# Patient Record
Sex: Male | Born: 1960 | Race: Black or African American | Marital: Married
Health system: Southern US, Community
[De-identification: ages and names within clinical notes are randomized; demographics above are authoritative.]

---

## 2016-09-05 ENCOUNTER — Other Ambulatory Visit: Payer: Self-pay | Admitting: Neurology

## 2016-09-05 DIAGNOSIS — R471 Dysarthria and anarthria: Secondary | ICD-10-CM

## 2016-09-15 ENCOUNTER — Ambulatory Visit
Admission: RE | Admit: 2016-09-15 | Discharge: 2016-09-15 | Disposition: A | Discharge status: Home / Self Care | Payer: BLUE CROSS/BLUE SHIELD | Source: Ambulatory Visit | Attending: Neurology | Admitting: Neurology

## 2016-09-15 DIAGNOSIS — R471 Dysarthria and anarthria: Secondary | ICD-10-CM

## 2018-08-07 IMAGING — MR MR HEAD W/O CM
10 series · 48 of 48 positions shown · non-contrast
Comparison: None.

CLINICAL DATA: Speech difficulty and right lower extremity
weakness.

EXAM:
MRI HEAD WITHOUT CONTRAST
TECHNIQUE: Multiplanar, multiecho pulse sequences of the brain and surrounding
structures were obtained without intravenous contrast.

[Series 2: t1_se_sag · sagittal · 5.0mm · 0.45mm/px · 3 of 21 slices shown]
[im 1/21]
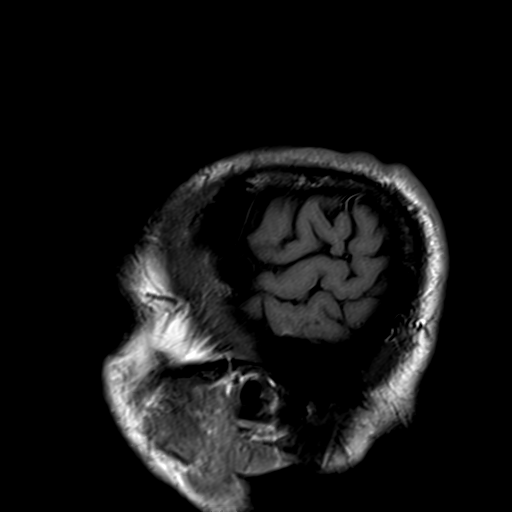
[im 11/21]
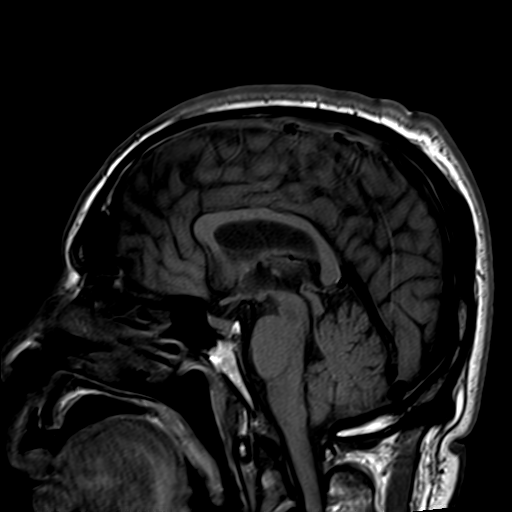
[im 21/21]
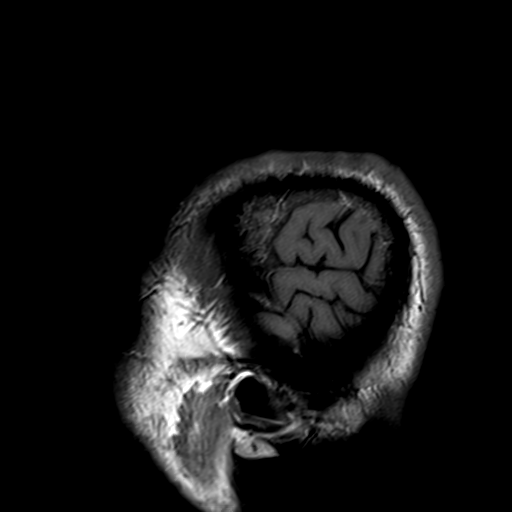

[Series 3: t2_tse_tra_512 · axial · 5.0mm · 0.60mm/px · z∈[-39,+92]mm · 2 of 24 slices shown]
[im 1/24]
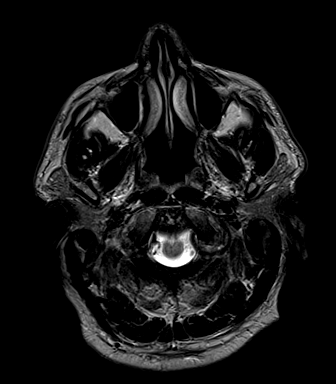
[im 24/24]
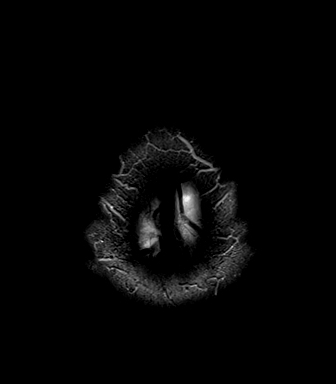

[Series 4: ep2d_diff_(id)_trace · axial · 3.0mm · 1.80mm/px · z∈[-39,+89]mm · 9 of 92 slices shown]
[im 1/92]
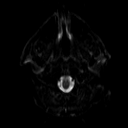
[im 12/92]
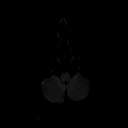
[im 23/92]
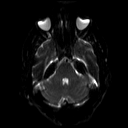
[im 35/92]
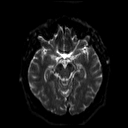
[im 46/92]
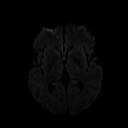
[im 57/92]
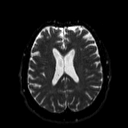
[im 69/92]
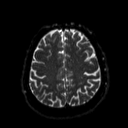
[im 80/92]
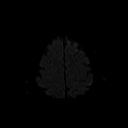
[im 92/92]
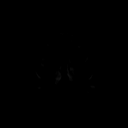

[Series 5: ep2d_diff_(id)_trace_adc · axial · 3.0mm · 1.80mm/px · z∈[-39,+89]mm · 5 of 46 slices shown]
[im 1/46]
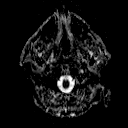
[im 12/46]
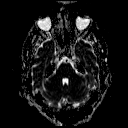
[im 23/46]
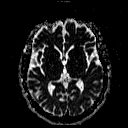
[im 34/46]
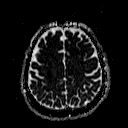
[im 46/46]
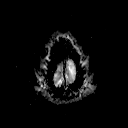

[Series 6: ep2d_diff_cor · coronal · 5.0mm · 1.77mm/px · 5 of 48 slices shown]
[im 1/48]
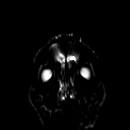
[im 12/48]
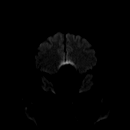
[im 24/48]
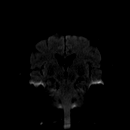
[im 36/48]
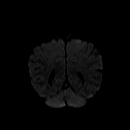
[im 48/48]
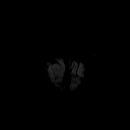

[Series 7: ep2d_diff_cor_adc · coronal · 5.0mm · 1.77mm/px · 2 of 24 slices shown]
[im 1/24]
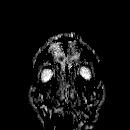
[im 24/24]
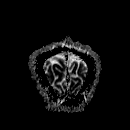

[Series 9: swi_images · axial · 5.0mm · 0.90mm/px · z∈[-42,+95]mm · 3 of 30 slices shown]
[im 1/30]
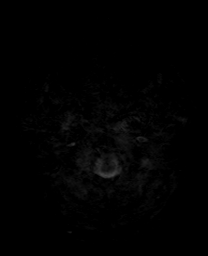
[im 15/30]
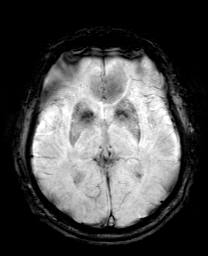
[im 30/30]
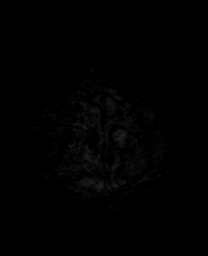

[Series 10: FLAIR · axial · 3.0mm · 0.43mm/px · z∈[-41,+89]mm · 3 of 28 slices shown]
[im 1/28]
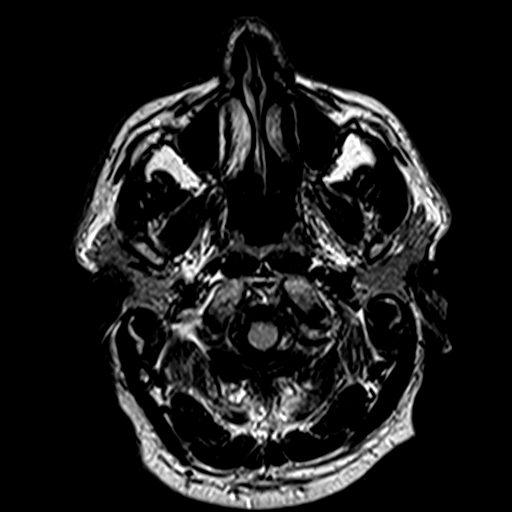
[im 14/28]
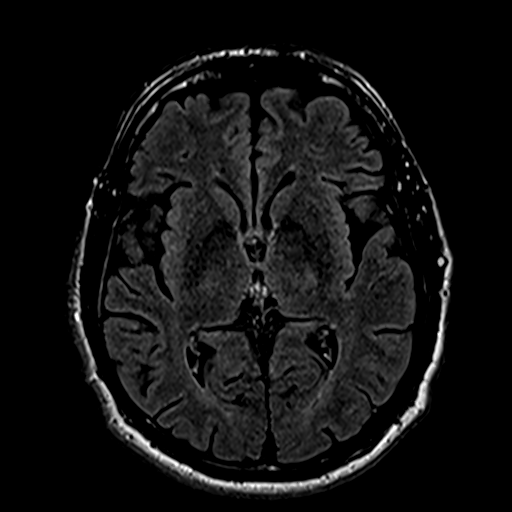
[im 28/28]
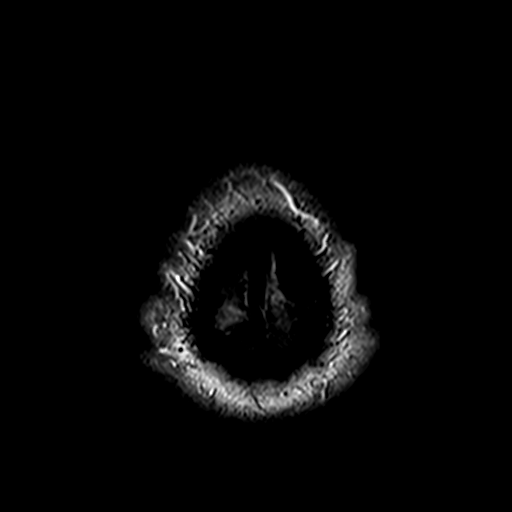

[Series 11: t1_mpr_tra · axial · 1.0mm · 0.72mm/px · z∈[-41,+94]mm · 14 of 144 slices shown]
[im 1/144]
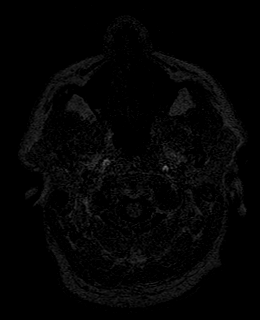
[im 12/144]
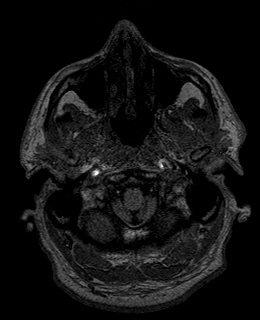
[im 23/144]
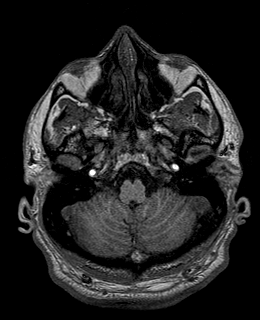
[im 34/144]
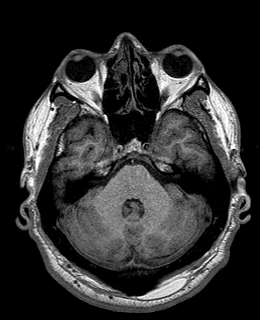
[im 45/144]
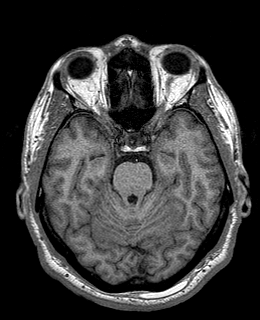
[im 56/144]
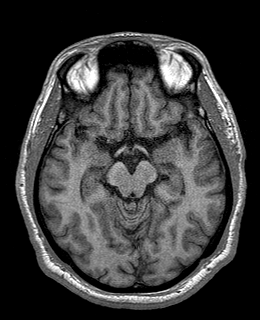
[im 67/144]
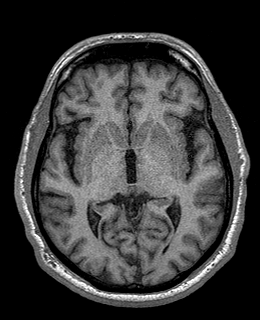
[im 78/144]
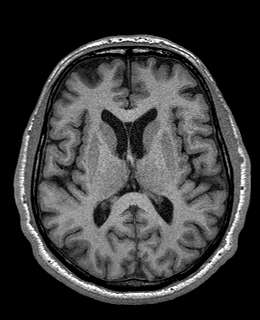
[im 89/144]
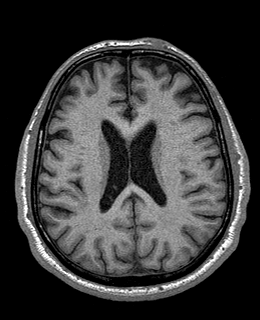
[im 100/144]
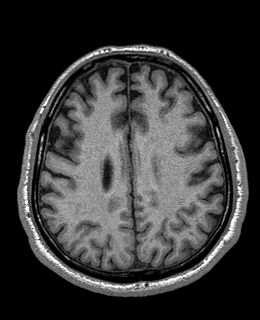
[im 111/144]
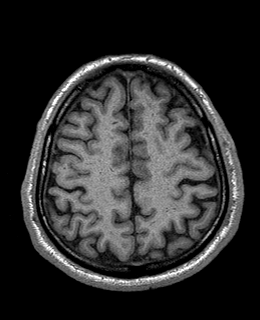
[im 122/144]
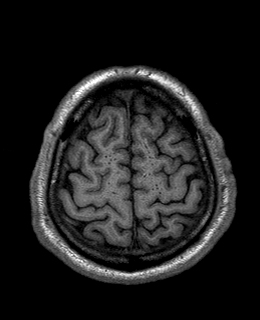
[im 133/144]
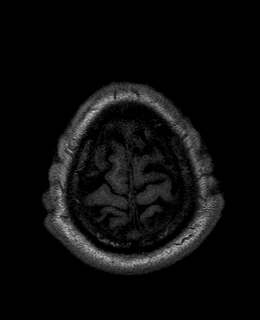
[im 144/144]
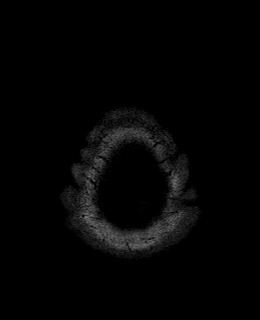

[Series 12: T2 · coronal · 5.0mm · 0.45mm/px · 2 of 24 slices shown]
[im 1/24]
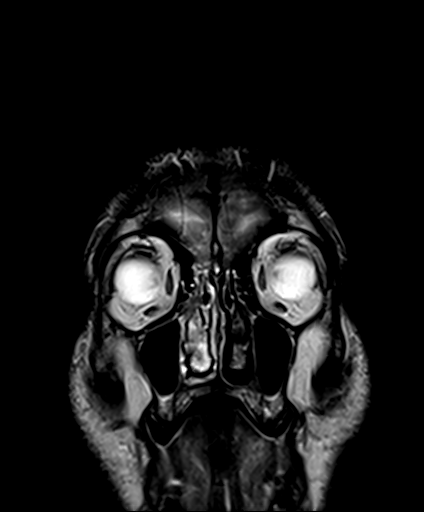
[im 24/24]
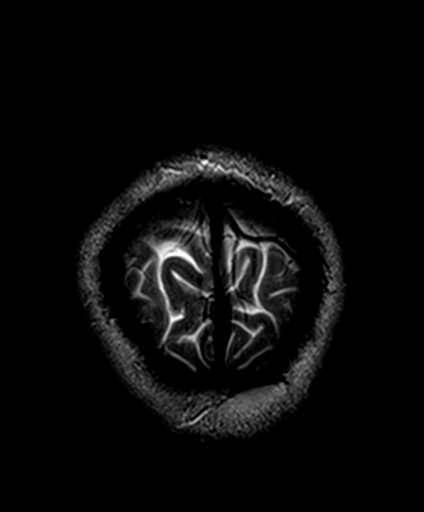

[48 of 48 positions shown; findings below may reference images not displayed]

FINDINGS: Brain: No focal diffusion restriction to indicate acute infarct. No
intraparenchymal hemorrhage. There is mild multifocal hyperintense
T2-weighted signal within the periventricular white matter, which
may be seen in the setting of migraine headaches or early chronic
microvascular disease; however, it is also seen in normal patients
of this age. No mass lesion or midline shift. No hydrocephalus or
extra-axial fluid collection. The midline structures are normal. No
age advanced or lobar predominant atrophy.

Vascular: Major intracranial arterial and venous sinus flow voids
are preserved. No evidence of chronic microhemorrhage or amyloid
angiopathy.

Skull and upper cervical spine: The visualized skull base,
calvarium, upper cervical spine and extracranial soft tissues are
normal.

Sinuses/Orbits: No fluid levels or advanced mucosal thickening. No
mastoid effusion. Normal orbits.
IMPRESSION: Normal MRI of the brain for age.

## 2022-09-27 LAB — AMB RESULTS CONSOLE CBG: Glucose: 135

## 2022-09-27 NOTE — Progress Notes (Signed)
Pt is not fasting. Pt has PCP.  

## 2022-10-15 ENCOUNTER — Encounter: Payer: Self-pay | Admitting: *Deleted

## 2022-10-15 NOTE — Progress Notes (Signed)
Pt attended 09/27/22 screening event where b/p was 121/78 and blood sugar was 135. At the event, the pt confirmed here PCP was Dr. Dennis Bast at [facility] in [place], and pt did not identify any SDOH insecurities. Per chart review, pt has been seen by her PCP on an ongoing basis and was last seen on 07/16/22 by Dr. Luiz Iron, at which time her A1C was 7.7. No additional health equity team support indicated at this time.
